# Patient Record
Sex: Male | Born: 2000
Health system: Southern US, Community
[De-identification: ages and names within clinical notes are randomized; demographics above are authoritative.]

## PROBLEM LIST (undated history)

## (undated) DIAGNOSIS — F909 Attention-deficit hyperactivity disorder, unspecified type: Secondary | ICD-10-CM

---
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
## 2018-12-31 IMAGING — DX DG ANKLE COMPLETE 3+V*R*
3 series · 3 of 3 positions shown · non-contrast
Comparison: None.

CLINICAL DATA: Recent injury with pain and swelling.

EXAM:
RIGHT ANKLE - COMPLETE 3+ VIEW

[ankle ap]
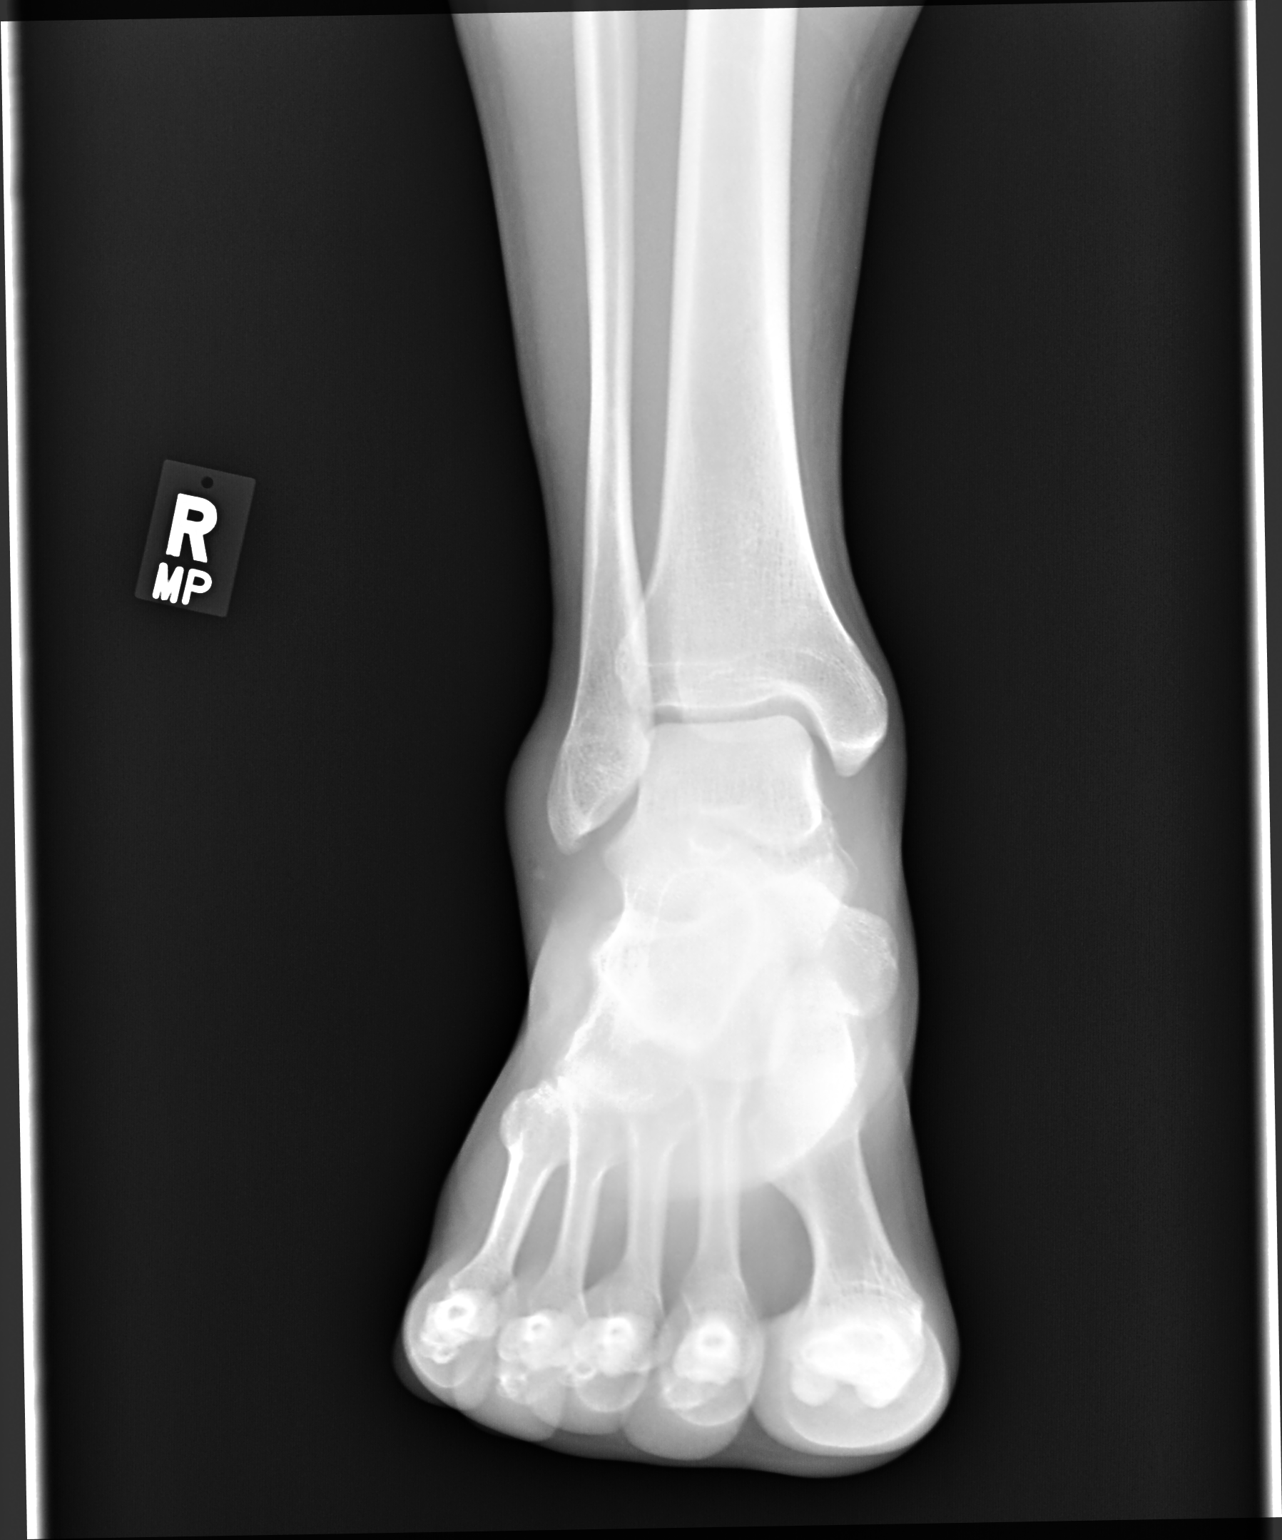

[ankle oblique]
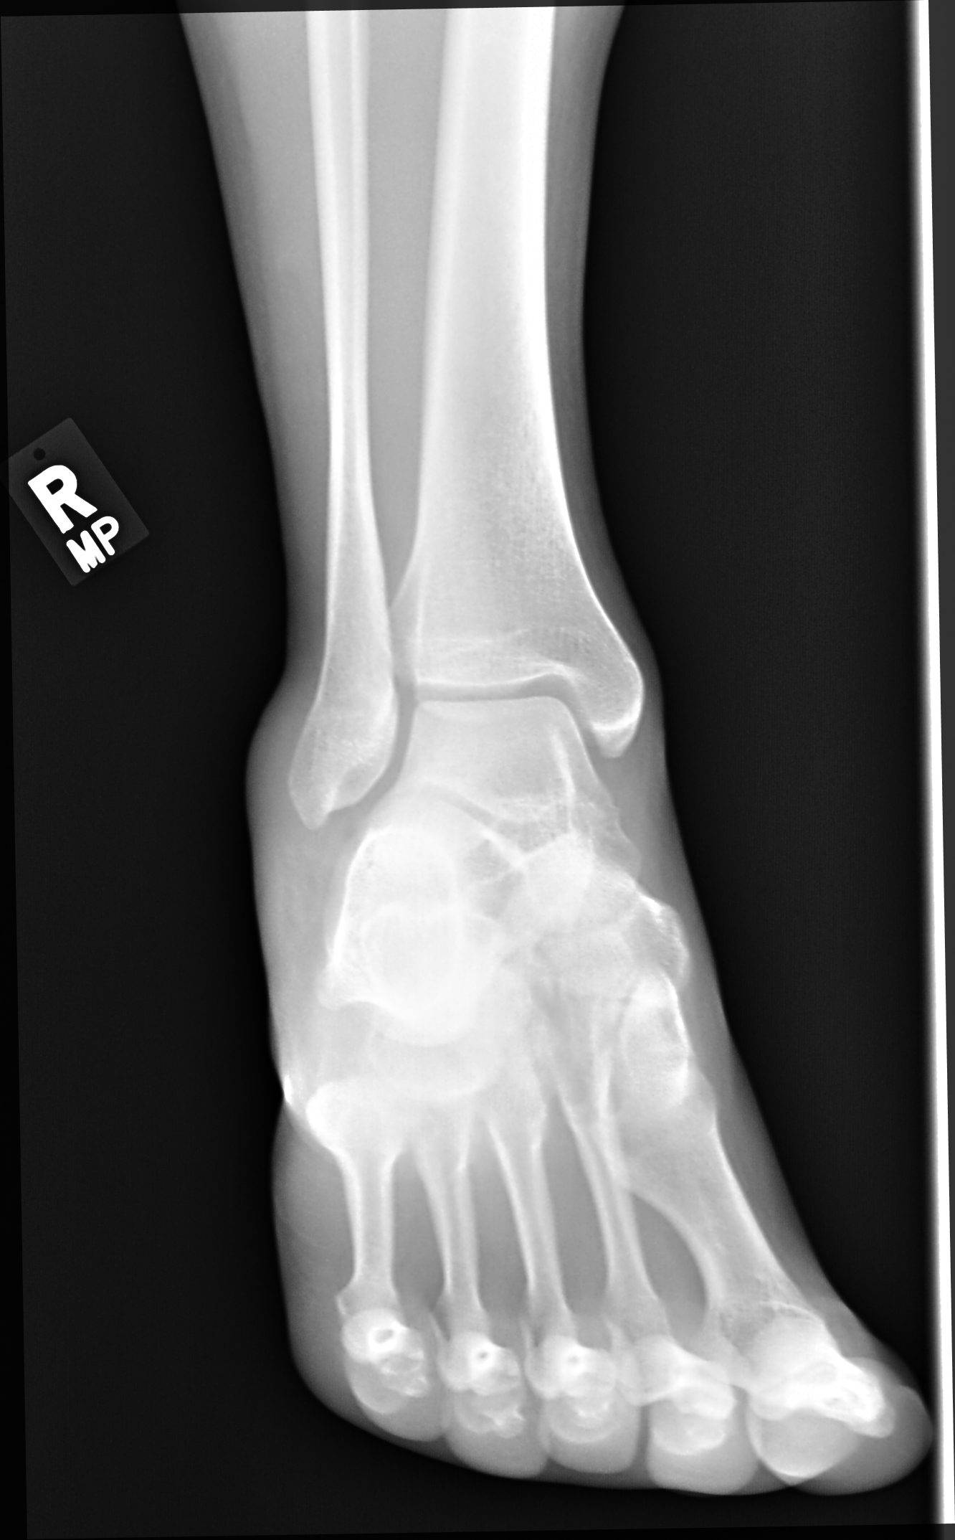

[ankle lat]
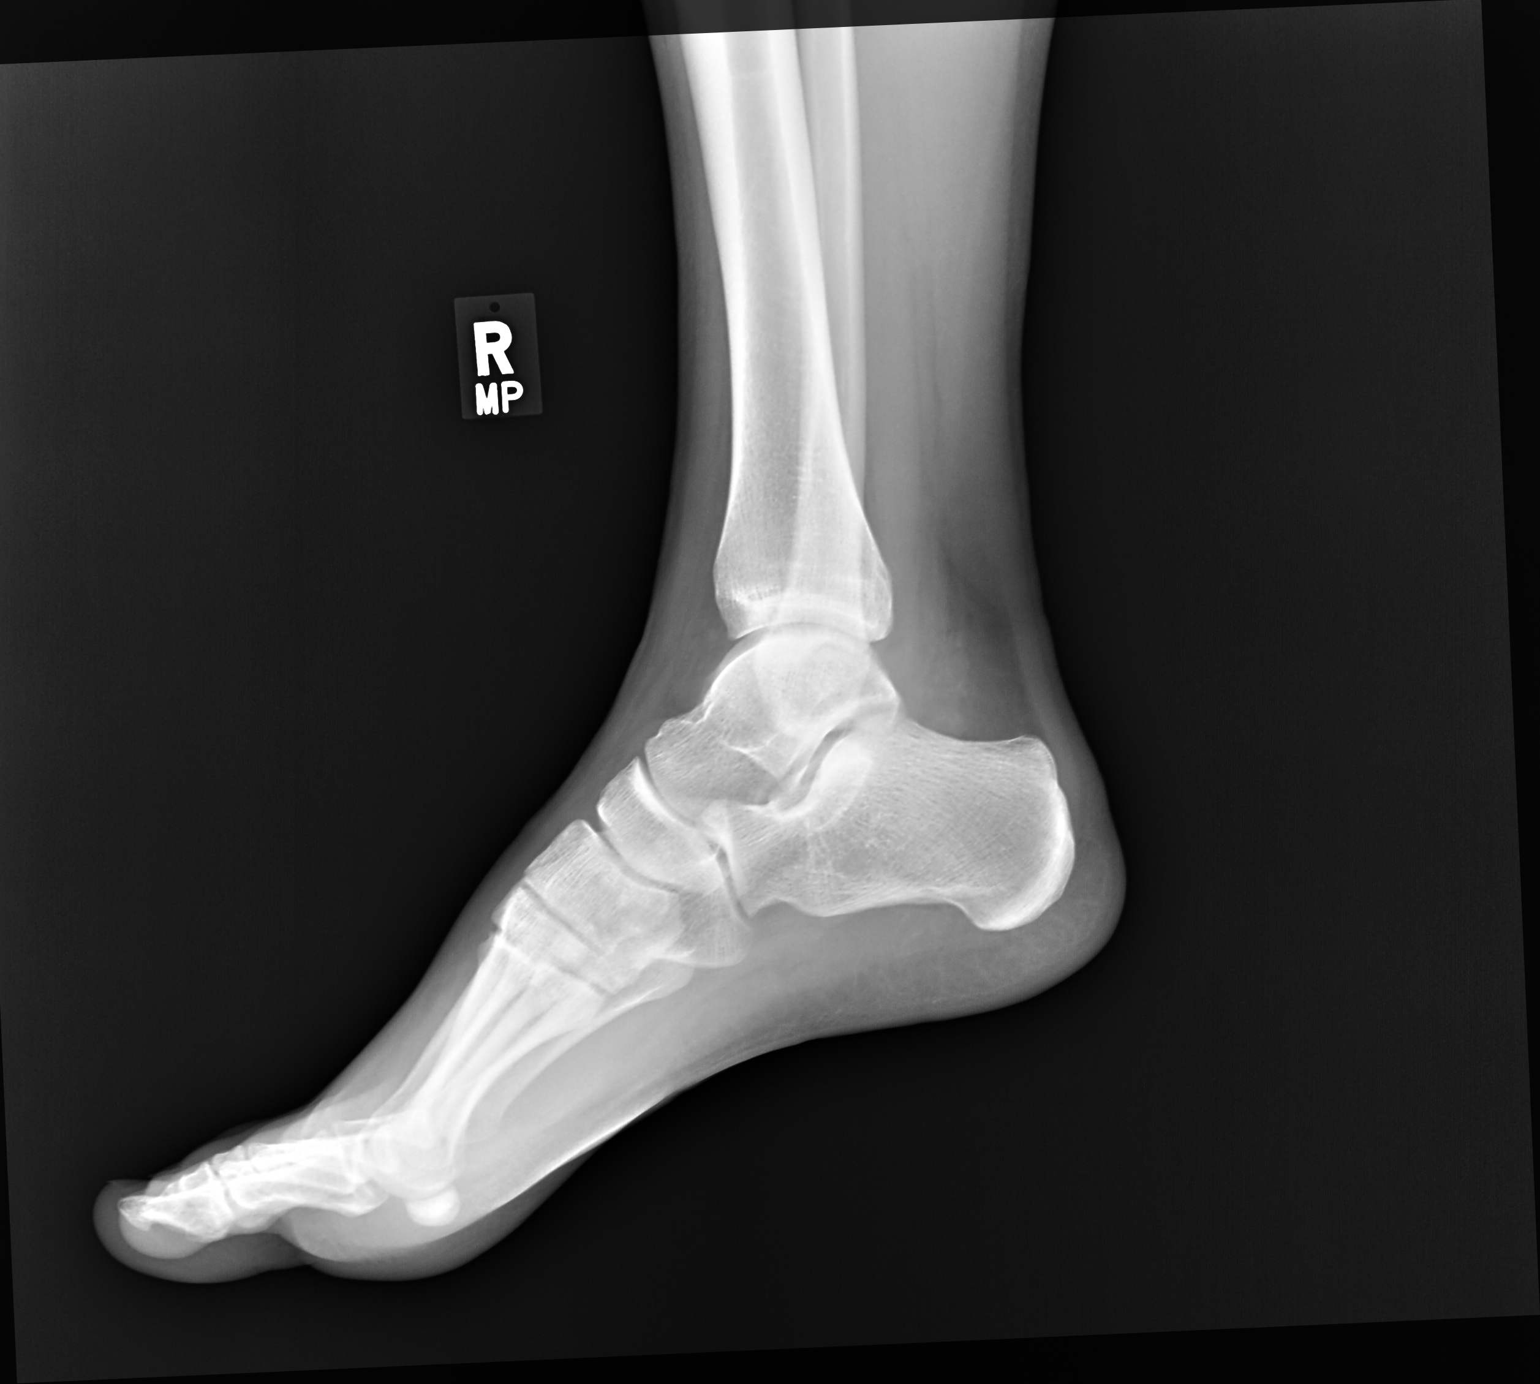

[3 of 3 positions shown; findings below may reference images not displayed]

FINDINGS: There is lateral soft tissue swelling. No evidence of fracture.
There is a small opacity in the soft tissues inferior to the fibula.
Was this an open injury? Could this be a foreign object? This does
not look like an avulsion fracture as it appears to be superficial.
If this is not an open injury, I would presume that this finding is
artifactual as it is not observed on the other images.
IMPRESSION: Lateral soft tissue swelling. Question density in the lateral soft
tissues as discussed above.

## 2019-05-01 ENCOUNTER — Emergency Department (HOSPITAL_COMMUNITY)
Admission: EM | Admit: 2019-05-01 | Discharge: 2019-05-01 | Disposition: A | Discharge status: Home / Self Care | Payer: 59 | Attending: Emergency Medicine | Admitting: Emergency Medicine

## 2019-05-01 ENCOUNTER — Encounter (HOSPITAL_COMMUNITY): Payer: Self-pay | Admitting: Emergency Medicine

## 2019-05-01 ENCOUNTER — Emergency Department (HOSPITAL_COMMUNITY): Payer: 59

## 2019-05-01 ENCOUNTER — Other Ambulatory Visit: Payer: Self-pay

## 2019-05-01 DIAGNOSIS — W2209XA Striking against other stationary object, initial encounter: Secondary | ICD-10-CM | POA: Diagnosis not present

## 2019-05-01 DIAGNOSIS — Y939 Activity, unspecified: Secondary | ICD-10-CM | POA: Insufficient documentation

## 2019-05-01 DIAGNOSIS — S6991XA Unspecified injury of right wrist, hand and finger(s), initial encounter: Secondary | ICD-10-CM | POA: Diagnosis present

## 2019-05-01 DIAGNOSIS — Y999 Unspecified external cause status: Secondary | ICD-10-CM | POA: Insufficient documentation

## 2019-05-01 DIAGNOSIS — S62316A Displaced fracture of base of fifth metacarpal bone, right hand, initial encounter for closed fracture: Secondary | ICD-10-CM | POA: Insufficient documentation

## 2019-05-01 DIAGNOSIS — Y929 Unspecified place or not applicable: Secondary | ICD-10-CM | POA: Insufficient documentation

## 2019-05-01 DIAGNOSIS — S62339A Displaced fracture of neck of unspecified metacarpal bone, initial encounter for closed fracture: Secondary | ICD-10-CM

## 2019-05-01 MED ORDER — IBUPROFEN 800 MG PO TABS: 800.0000 mg | ORAL_TABLET | Freq: Three times a day (TID) | ORAL | 0 refills | Status: AC

## 2019-05-01 NOTE — Discharge Instructions (Signed)
Contact a health care provider if:  Your pain is getting worse.  You have more redness, swelling, or pain in the injured area.  You have a fever.  Your cast or splint feels too tight or too loose.  Your cast is coming apart.  Get help right away if:  You have trouble breathing.  You have severe pain.  Your skin or nails on your injured hand turn blue or gray even after you loosen your splint.  Your injured hand feels cold or becomes numb even after you loosen your splint.  You develop a rash.

## 2019-05-01 NOTE — ED Provider Notes (Signed)
[facility]-EMERGENCY DEPT Provider Note   CSN: [id number] Arrival date & time: 05/01/19  1514     History   Chief Complaint Chief Complaint  Patient presents with  . Hand Pain    HPI Jacob Lopez is a 18 y.o. male.     The history is provided by the patient. No language interpreter was used.  Hand Injury Location:  Hand Hand location:  R hand Injury: yes   Time since incident:  16 hours Mechanism of injury comment:  Punched a wall Pain details:    Quality:  Aching and throbbing   Radiates to:  Does not radiate   Severity:  Moderate   Onset quality:  Sudden   Timing:  Constant   Progression:  Unchanged Handedness:  Right-handed Dislocation: no   Foreign body present:  No foreign bodies Prior injury to area:  No Relieved by:  Rest Worsened by:  Movement Ineffective treatments:  None tried Associated symptoms: decreased range of motion and swelling   Associated symptoms: no back pain, no fatigue, no fever, no muscle weakness, no neck pain, no numbness, no stiffness and no tingling   Risk factors: no concern for non-accidental trauma, no known bone disorder, no frequent fractures and no recent illness     Past Medical History:  Diagnosis Date  . ADHD (attention deficit hyperactivity disorder)     Patient Active Problem List   Diagnosis Date Noted  . ODD (oppositional defiant disorder) 03/19/2016  . Attention deficit hyperactivity disorder (ADHD) 03/19/2016  . Acne, mild- facial 03/19/2016  . Counseling on health promotion and disease prevention 03/19/2016    Past Surgical History:  Procedure Laterality Date  . APPENDECTOMY  07/08/2010        Home Medications    Prior to Admission medications   Medication Sig Start Date End Date Taking? Authorizing Provider  ibuprofen (ADVIL,MOTRIN) 600 MG tablet Take 1 tablet (600 mg total) by mouth every 8 (eight) hours as needed for moderate pain. 04/16/17   Thomasene Lot, DO     Family History Family History  Problem Relation Age of Onset  . Diabetes Mother        gestational  . Hypertension Mother   . Depression Father   . Cancer Maternal Grandmother        lung, brain, bone  . Hypertension Maternal Grandmother   . Diabetes Maternal Grandfather   . Cancer Paternal Grandmother        throat  . Depression Paternal Grandmother   . Depression Paternal Grandfather   . Stroke Paternal Grandfather     Social History Social History   Tobacco Use  . Smoking status: Never Smoker  . Smokeless tobacco: Never Used  Substance Use Topics  . Alcohol use: No  . Drug use: No     Allergies   Patient has no known allergies.   Review of Systems Review of Systems  Constitutional: Negative for fatigue and fever.  Musculoskeletal: Negative for back pain, neck pain and stiffness.  Skin: Negative for wound.  Neurological: Negative for weakness and light-headedness.     Physical Exam Updated Vital Signs BP 135/86 (BP Location: Left Arm)   Pulse 74   Temp 98.2 F (36.8 C)   Resp 16   SpO2 99%   Physical Exam Vitals signs and nursing note reviewed.  Constitutional:      General: He is not in acute distress.    Appearance: He is well-developed. He is not diaphoretic.  HENT:     Head: Normocephalic and atraumatic.  Eyes:     General: No scleral icterus.    Conjunctiva/sclera: Conjunctivae normal.  Neck:     Musculoskeletal: Normal range of motion and neck supple.  Cardiovascular:     Rate and Rhythm: Normal rate and regular rhythm.     Heart sounds: Normal heart sounds.  Pulmonary:     Effort: Pulmonary effort is normal. No respiratory distress.     Breath sounds: Normal breath sounds.  Abdominal:     Palpations: Abdomen is soft.     Tenderness: There is no abdominal tenderness.  Musculoskeletal:     Comments: RUE exam Inspection- Swelling noted over the proximal aspect of the right fifth metacarpal. no erythema, atrophy, hypertrophy, abrasions,  or lacerations noted. Palpation-tenderness to palpation over the proximal fourth and fifth metacarpal region, compartments soft ROM- Full ROM about the shoulder, elbow, wrist. Strength- 5/5 AIN/PIN/U NV- SILT M/R/U, +2 Radial +2 ulnar pulse   Skin:    General: Skin is warm and dry.  Neurological:     Mental Status: He is alert.  Psychiatric:        Behavior: Behavior normal.      ED Treatments / Results  Labs (all labs ordered are listed, but only abnormal results are displayed) Labs Reviewed - No data to display  EKG None  Radiology Dg Hand Complete Right  Result Date: 05/01/2019 CLINICAL DATA:  18 year old male with trauma to the right hand. EXAM: RIGHT HAND - COMPLETE 3+ VIEW COMPARISON:  None. FINDINGS: There is a fracture of the base of the fifth metacarpal with a minimally displaced small fracture fragment. No other acute fracture identified. The bones are well mineralized. No arthritic change. There is no dislocation. Mild soft tissue swelling over the fifth metacarpal. No radiopaque foreign object or soft tissue gas. IMPRESSION: 1. Minimally angulated fracture of the base of the fifth metacarpal with a minimally displaced small fracture fragment. No dislocation. 2. Soft tissue swelling over the fifth metacarpal. Electronically Signed   By: Anner Crete M.D.   On: 05/01/2019 15:47    Procedures Procedures (including critical care time)   SPLINT APPLICATION Date/Time: 56:43 PM Authorized by: Margarita Mail Consent: Verbal consent obtained. Risks and benefits: risks, benefits and alternatives were discussed Consent given by: patient Splint applied by: orthopedic technician Location details: Right hand Splint type: Ulnar gutter Supplies used: Ortho-Glass Post-procedure: The splinted body part was neurovascularly unchanged following the procedure. Patient tolerance: Patient tolerated the procedure well with no immediate complications.    Medications Ordered in  ED Medications - No data to display   Initial Impression / Assessment and Plan / ED Course  I have reviewed the triage vital signs and the nursing notes.  Pertinent labs & imaging results that were available during my care of the patient were reviewed by me and considered in my medical decision making (see chart for details).        18 year old male here with right hand injury after punching a wall.  I personally reviewed the patient's right hand x-ray which shows a proximal fifth metacarpal fracture.  Patient placed in ulnar gutter splint.  He is neurovascularly intact after splint placement.  Given hand follow-up.  Patient appears appropriate for discharge at this time.  Final Clinical Impressions(s) / ED Diagnoses   Final diagnoses:  Closed boxer's fracture, initial encounter    ED Discharge Orders    None       Margarita Mail, PA-C  05/01/19 2310    Loren RacerYelverton, David, MD 05/01/19 2317
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
## 2020-12-07 ENCOUNTER — Encounter (HOSPITAL_COMMUNITY): Payer: Self-pay | Admitting: *Deleted

## 2020-12-07 ENCOUNTER — Emergency Department (HOSPITAL_COMMUNITY): Payer: Self-pay

## 2020-12-07 ENCOUNTER — Emergency Department (HOSPITAL_COMMUNITY)
Admission: EM | Admit: 2020-12-07 | Discharge: 2020-12-07 | Disposition: A | Discharge status: Home / Self Care | Payer: Self-pay | Attending: Emergency Medicine | Admitting: Emergency Medicine

## 2020-12-07 DIAGNOSIS — S20211A Contusion of right front wall of thorax, initial encounter: Secondary | ICD-10-CM | POA: Insufficient documentation

## 2020-12-07 DIAGNOSIS — W19XXXA Unspecified fall, initial encounter: Secondary | ICD-10-CM | POA: Insufficient documentation

## 2020-12-07 NOTE — Discharge Instructions (Signed)
You can take Tylenol or Ibuprofen as directed for pain. You can alternate Tylenol and Ibuprofen every 4 hours. If you take Tylenol at 1pm, then you can take Ibuprofen at 5pm. Then you can take Tylenol again at 9pm.   Return emergency department for any worsening pain, difficulty breathing, vomiting or any other worsening concerning symptoms.
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
## 2022-08-23 IMAGING — CR DG RIBS W/ CHEST 3+V*R*
4 series · 4 of 4 positions shown · non-contrast
Comparison: None.

CLINICAL DATA: Right rib pain after fall.

EXAM:
RIGHT RIBS AND CHEST - 3+ VIEW

[w chest pa]
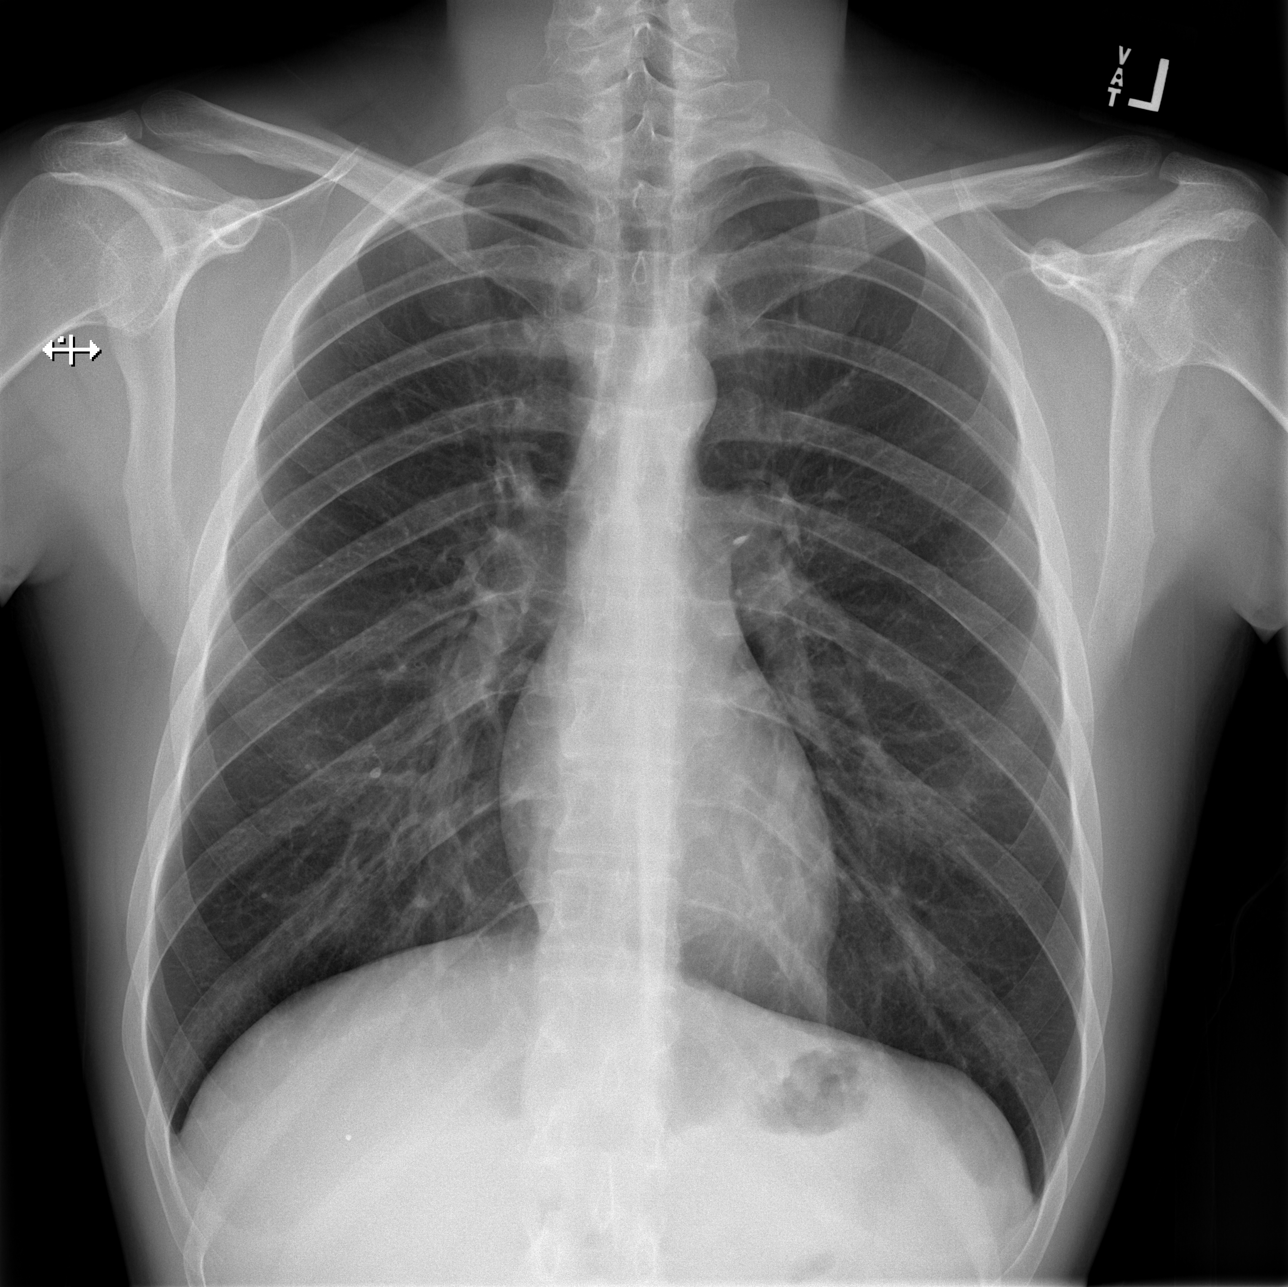

[w ribs ap upper right]
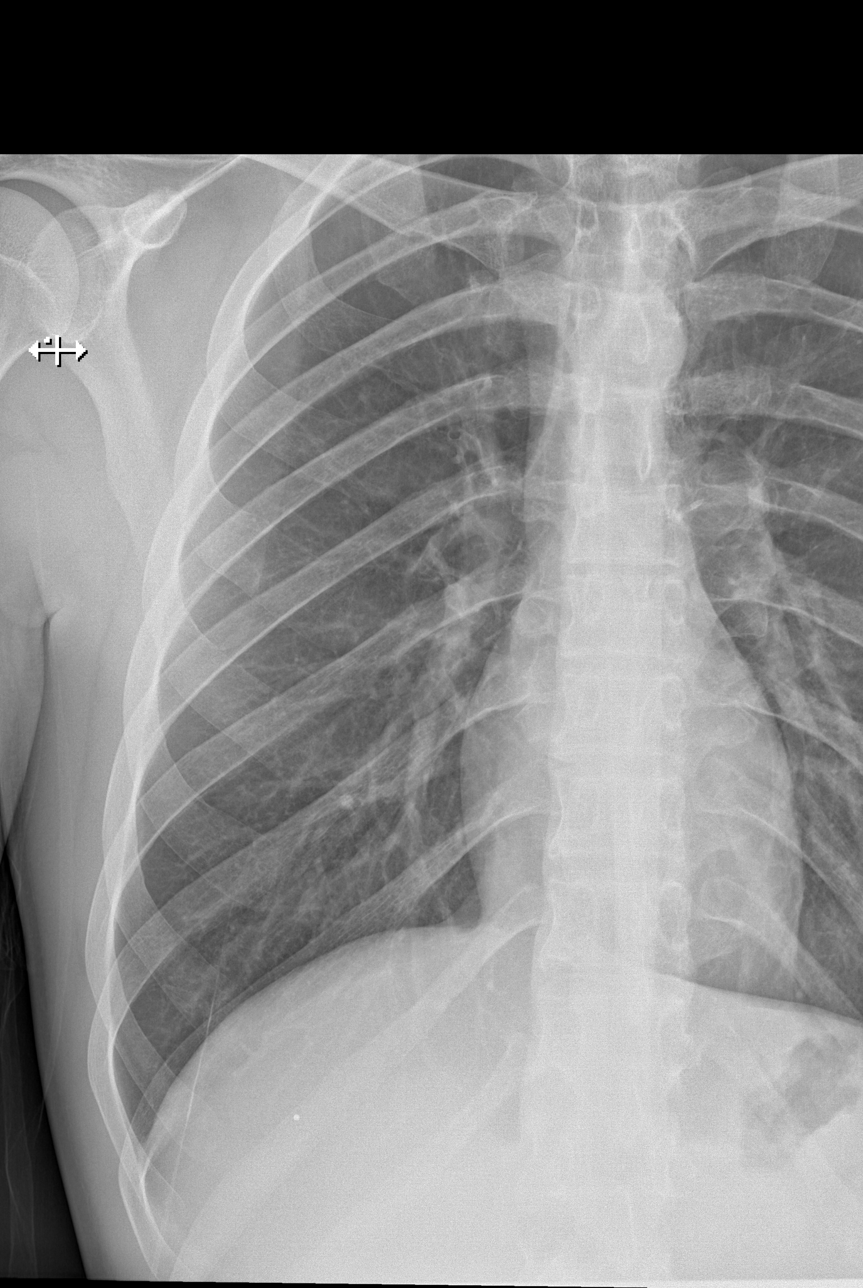

[w ribs ap lower right]
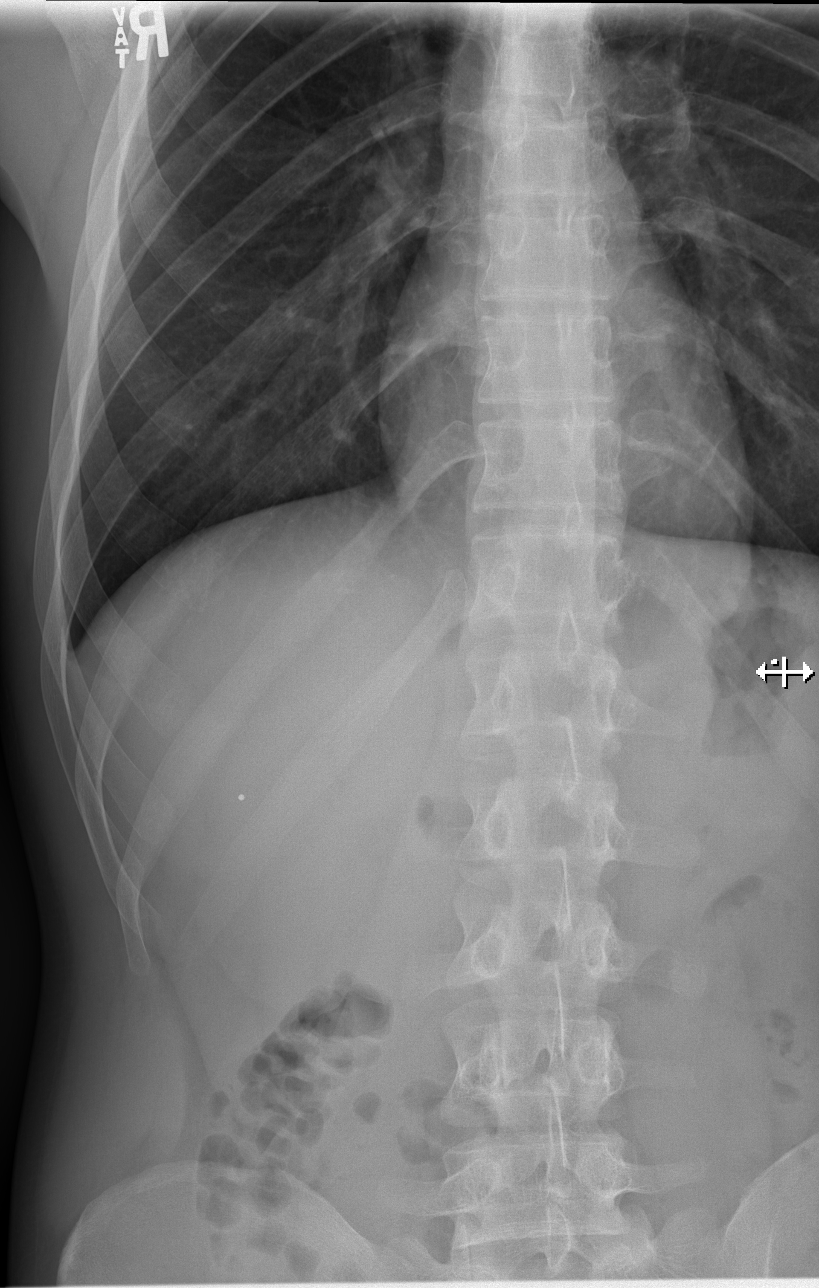

[w ribs obl right]
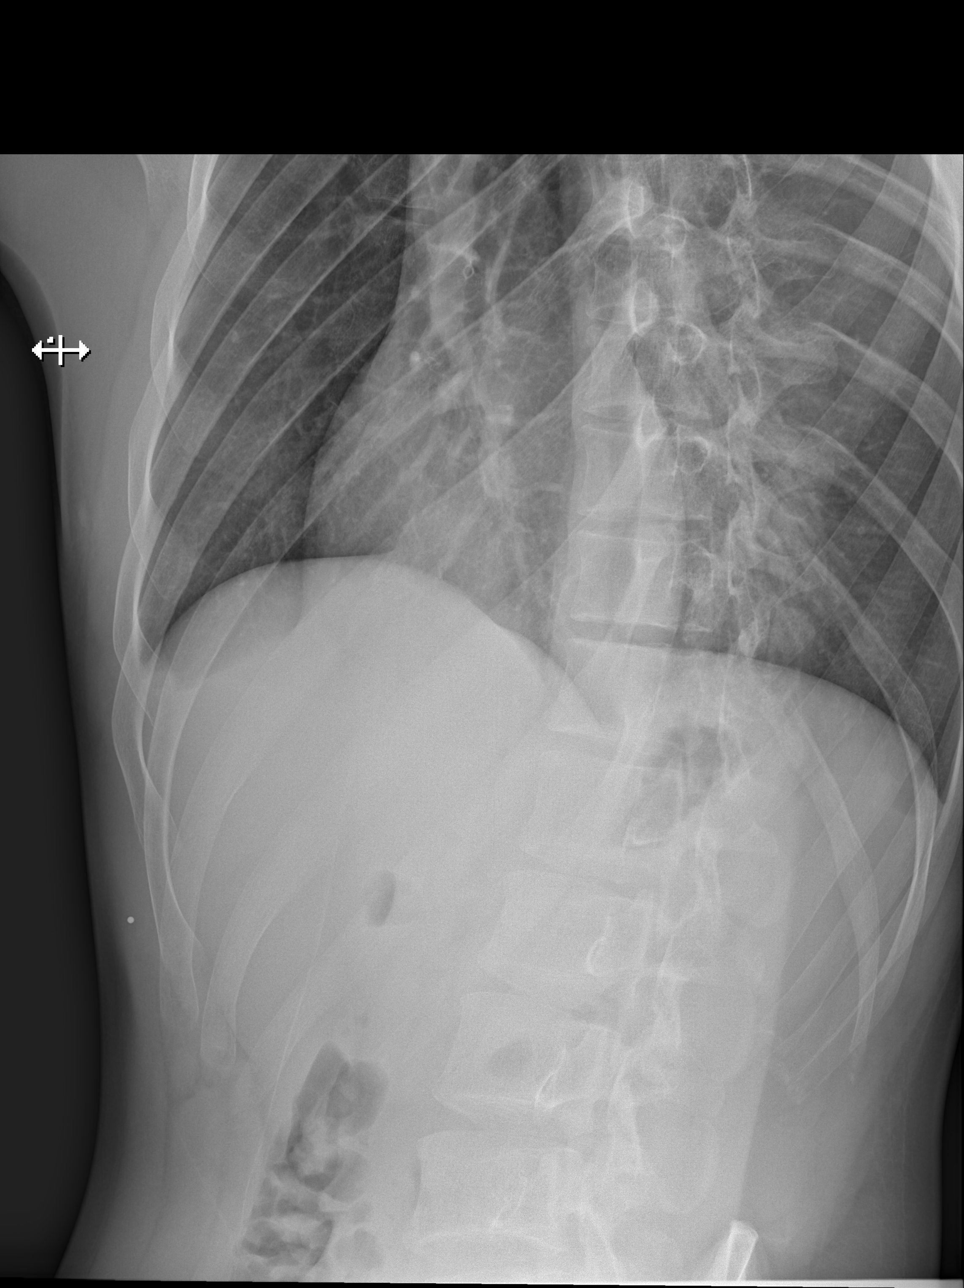

[4 of 4 positions shown; findings below may reference images not displayed]

FINDINGS: No fracture or other bone lesions are seen involving the ribs. There
is no evidence of pneumothorax or pleural effusion. Both lungs are
clear. Heart size and mediastinal contours are within normal limits.
IMPRESSION: Negative.
